# Patient Record
Sex: Female | Born: 1990
Health system: Southern US, Community
[De-identification: ages and names within clinical notes are randomized; demographics above are authoritative.]

---
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
## 2008-01-21 ENCOUNTER — Other Ambulatory Visit: Admission: RE | Admit: 2008-01-21 | Discharge: 2008-01-21 | Payer: Self-pay | Admitting: Family Medicine

## 2008-01-25 ENCOUNTER — Encounter: Admission: RE | Admit: 2008-01-25 | Discharge: 2008-01-25 | Payer: Self-pay | Admitting: Family Medicine

## 2009-03-31 ENCOUNTER — Other Ambulatory Visit: Admission: RE | Admit: 2009-03-31 | Discharge: 2009-03-31 | Payer: Self-pay | Admitting: Family Medicine

## 2010-09-10 ENCOUNTER — Other Ambulatory Visit: Payer: Self-pay | Admitting: Family Medicine

## 2010-09-10 ENCOUNTER — Other Ambulatory Visit (HOSPITAL_COMMUNITY)
Admission: RE | Admit: 2010-09-10 | Discharge: 2010-09-10 | Disposition: A | Discharge status: Home / Self Care | Payer: 59 | Source: Ambulatory Visit | Attending: Family Medicine | Admitting: Family Medicine

## 2010-09-10 DIAGNOSIS — Z113 Encounter for screening for infections with a predominantly sexual mode of transmission: Secondary | ICD-10-CM | POA: Insufficient documentation

## 2010-09-10 DIAGNOSIS — Z124 Encounter for screening for malignant neoplasm of cervix: Secondary | ICD-10-CM | POA: Insufficient documentation

## 2013-11-11 ENCOUNTER — Other Ambulatory Visit: Payer: Self-pay | Admitting: Family Medicine

## 2013-11-11 ENCOUNTER — Other Ambulatory Visit (HOSPITAL_COMMUNITY)
Admission: RE | Admit: 2013-11-11 | Discharge: 2013-11-11 | Disposition: A | Discharge status: Home / Self Care | Payer: 59 | Source: Ambulatory Visit | Attending: Family Medicine | Admitting: Family Medicine

## 2013-11-11 DIAGNOSIS — Z01419 Encounter for gynecological examination (general) (routine) without abnormal findings: Secondary | ICD-10-CM | POA: Insufficient documentation

## 2013-12-08 ENCOUNTER — Other Ambulatory Visit: Payer: Self-pay | Admitting: Family Medicine

## 2013-12-08 DIAGNOSIS — N911 Secondary amenorrhea: Secondary | ICD-10-CM

## 2013-12-08 DIAGNOSIS — N912 Amenorrhea, unspecified: Secondary | ICD-10-CM

## 2013-12-21 ENCOUNTER — Other Ambulatory Visit: Payer: 59

## 2014-01-18 ENCOUNTER — Other Ambulatory Visit: Payer: 59

## 2017-06-03 ENCOUNTER — Other Ambulatory Visit (HOSPITAL_COMMUNITY)
Admission: RE | Admit: 2017-06-03 | Discharge: 2017-06-03 | Disposition: A | Discharge status: Home / Self Care | Payer: BLUE CROSS/BLUE SHIELD | Source: Ambulatory Visit | Attending: Family Medicine | Admitting: Family Medicine

## 2017-06-03 ENCOUNTER — Other Ambulatory Visit: Payer: Self-pay | Admitting: Family Medicine

## 2017-06-03 DIAGNOSIS — Z124 Encounter for screening for malignant neoplasm of cervix: Secondary | ICD-10-CM | POA: Diagnosis present

## 2017-06-04 LAB — CYTOLOGY - PAP
Diagnosis: NEGATIVE
HPV (WINDOPATH): NOT DETECTED

## 2018-05-25 ENCOUNTER — Ambulatory Visit (INDEPENDENT_AMBULATORY_CARE_PROVIDER_SITE_OTHER): Payer: 59

## 2018-05-25 ENCOUNTER — Encounter (HOSPITAL_COMMUNITY): Payer: Self-pay | Admitting: Family Medicine

## 2018-05-25 ENCOUNTER — Ambulatory Visit (HOSPITAL_COMMUNITY)
Admission: EM | Admit: 2018-05-25 | Discharge: 2018-05-25 | Disposition: A | Discharge status: Home / Self Care | Payer: 59 | Attending: Family Medicine | Admitting: Family Medicine

## 2018-05-25 DIAGNOSIS — M79641 Pain in right hand: Secondary | ICD-10-CM

## 2018-05-25 DIAGNOSIS — S62524A Nondisplaced fracture of distal phalanx of right thumb, initial encounter for closed fracture: Secondary | ICD-10-CM

## 2018-05-25 MED ORDER — IBUPROFEN 800 MG PO TABS: 800.0000 mg | ORAL_TABLET | Freq: Three times a day (TID) | ORAL | 0 refills | Status: DC
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
## 2018-05-25 NOTE — Discharge Instructions (Addendum)
You have fracture your right thumb  We will place you in a splint and have you follow up with ortho Ibuprofen for pain and inflammation. Ice/elevate
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
## 2018-05-26 ENCOUNTER — Encounter (INDEPENDENT_AMBULATORY_CARE_PROVIDER_SITE_OTHER): Payer: Self-pay | Admitting: Family Medicine

## 2018-05-26 ENCOUNTER — Ambulatory Visit (INDEPENDENT_AMBULATORY_CARE_PROVIDER_SITE_OTHER): Payer: 59 | Admitting: Family Medicine

## 2018-05-26 DIAGNOSIS — S62524A Nondisplaced fracture of distal phalanx of right thumb, initial encounter for closed fracture: Secondary | ICD-10-CM

## 2018-05-26 MED ORDER — HYDROCODONE-ACETAMINOPHEN 5-325 MG PO TABS: 1.0000 | ORAL_TABLET | Freq: Four times a day (QID) | ORAL | 0 refills | Status: DC | PRN
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
## 2018-06-09 ENCOUNTER — Encounter (INDEPENDENT_AMBULATORY_CARE_PROVIDER_SITE_OTHER): Payer: Self-pay | Admitting: Family Medicine

## 2018-06-09 ENCOUNTER — Ambulatory Visit (INDEPENDENT_AMBULATORY_CARE_PROVIDER_SITE_OTHER): Payer: Self-pay

## 2018-06-09 ENCOUNTER — Ambulatory Visit (INDEPENDENT_AMBULATORY_CARE_PROVIDER_SITE_OTHER): Payer: 59 | Admitting: Family Medicine

## 2018-06-09 DIAGNOSIS — S62524D Nondisplaced fracture of distal phalanx of right thumb, subsequent encounter for fracture with routine healing: Secondary | ICD-10-CM | POA: Diagnosis not present

## 2018-06-09 NOTE — Progress Notes (Signed)
I saw and examined the patient with Dr. Jamse MeadBarron and agree with assessment and plan as outlined.  Fracture stable and with anatomic alignment.  Will start carefully working on ROM.  X-Ray in 2-3 weeks.
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
## 2018-06-10 ENCOUNTER — Ambulatory Visit (INDEPENDENT_AMBULATORY_CARE_PROVIDER_SITE_OTHER): Payer: 59 | Admitting: Family Medicine

## 2018-06-10 DIAGNOSIS — E118 Type 2 diabetes mellitus with unspecified complications: Secondary | ICD-10-CM | POA: Diagnosis not present

## 2018-06-10 DIAGNOSIS — Z13220 Encounter for screening for lipoid disorders: Secondary | ICD-10-CM | POA: Diagnosis not present

## 2018-06-10 DIAGNOSIS — Z0000 Encounter for general adult medical examination without abnormal findings: Secondary | ICD-10-CM | POA: Diagnosis not present

## 2018-06-15 ENCOUNTER — Encounter: Payer: Self-pay | Admitting: Registered"

## 2018-06-15 ENCOUNTER — Encounter: Payer: 59 | Attending: Family Medicine | Admitting: Registered"

## 2018-06-15 DIAGNOSIS — Z713 Dietary counseling and surveillance: Secondary | ICD-10-CM | POA: Diagnosis not present

## 2018-06-15 DIAGNOSIS — E119 Type 2 diabetes mellitus without complications: Secondary | ICD-10-CM | POA: Diagnosis not present

## 2018-06-15 NOTE — Progress Notes (Signed)
Diabetes Self-Management Education  Visit Type: First/Initial  Appt. Start Time: 0910 Appt. End Time: 1010  06/15/2018  Ms. Kelly Castro, identified by name and date of birth, is a 27 y.o. female with a diagnosis of Diabetes: Type 2.   ASSESSMENT Pt states she has had pre-diabetes for awhile. Pt states even though she was not diagnosed with T2DM until this month she has been taking 500 mg of Metformin x1 year. Pt states she remembers her father checking his blood sugar often managing his T2DM.  Pt states she has history of irregular periods and has had them regulated with birth control for awhile. Pt states PCOS has not been discussed or investigated. RD did not go into a lot of detail regarding PCOS, but believes Ovasitol may help her even if she does not have PCOS.  Pt states she knows breakfast is hard because she needs something quick. Pt states she will drink an occasional sweet tea, but mostly drinks non-sugar beverages.  Sleep: 4-5 hrs or 8 hrs. Pt states her doctor has told her it is due to anxiety and she takes a OTC stress formula which she thinks has melatonin which helps her sleep within 1/2 hr-45 min. Pt reports restless legs. Stress: 6/10 mostly due to work demands Energy: 4/10 - time of year and work. 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 Pt states she enjoys swimming and does more in the spring, in charge of aquatics classes. 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 Pt states she is working 2 jobs trying to save to buy a house.  Sample provided: Ovasitol Lot #: [id number] Exp: 7/21  Diabetes Self-Management Education - 06/15/18 0918      Visit Information   Visit Type  First/Initial      Initial Visit   Diabetes Type  Type 2    Are you currently following a meal plan?  No    Are you taking your medications as  prescribed?  Yes    Date Diagnosed  05/2018      Health Coping   How would you rate your overall health?  Excellent      Psychosocial Assessment   Patient Belief/Attitude about Diabetes  Motivated to manage diabetes    How often do you need to have someone help you when you read instructions, pamphlets, or other written materials from your doctor or pharmacy?  1 - Never    What is the last grade level you completed in school?  graduate school      Complications   Last HgB A1C per patient/outside source  6.7 %    How often do you check your blood sugar?  0 times/day (not testing)    Have you had a dilated eye exam in the past 12 months?  Yes    Have you had a dental exam in the past 12 months?  Yes    Are you checking your feet?  No      Dietary Intake   Breakfast  cereal OR bagel, cream chees or with egg & canadian bacon    Snack (morning)  none    Lunch  lean cuisine OR subway, chips, coke zero    Snack (afternoon)  none    Financial risk analyst (evening)  none  Beverage(s)  water, diet coke      Exercise   Exercise Type  Light (walking / raking leaves)    How many days per week to you exercise?  1    How many minutes per day do you exercise?  30    Total minutes per week of exercise  30      Patient Education   Previous Diabetes Education  No    Disease state   Definition of diabetes, type 1 and 2, and the diagnosis of diabetes    Nutrition management   Role of diet in the treatment of diabetes and the relationship between the three main macronutrients and blood glucose level;Food label reading, portion sizes and measuring food.    Physical activity and exercise   Role of exercise on diabetes management, blood pressure control and cardiac health.    Medications  Reviewed patients medication for diabetes, action, purpose, timing of dose and side effects.    Monitoring  Purpose and frequency of SMBG.    Psychosocial adjustment  Role of stress on  diabetes      Individualized Goals (developed by patient)   Nutrition  General guidelines for healthy choices and portions discussed    Physical Activity  Exercise 3-5 times per week    Medications  take my medication as prescribed      Outcomes   Expected Outcomes  Demonstrated interest in learning. Expect positive outcomes    Future DMSE  PRN    Program Status  Completed      Individualized Plan for Diabetes Self-Management Training:   Learning Objective:  Patient will have a greater understanding of diabetes self-management. Patient education plan is to attend individual and/or group sessions per assessed needs and concerns.   Patient Instructions  Consider getting more protein with breakfast Consider trying Subway salad Consider getting more regular exercise. Ultimate goal is 3-5 x per week at moderate intensity. You can try Calm magnesium supplement for restless legs, better sleep. Mindful Meditation free 8-week course online https://palousemindfulness.com/ You can try Ovasitol to help with blood sugar control  Expected Outcomes:  Demonstrated interest in learning. Expect positive outcomes  Education material provided: ADA Diabetes: Your Take Control Guide and A1C conversion sheet. What is Inositol?  If problems or questions, patient to contact team via:  Phone  Future DSME appointment: PRN

## 2018-06-15 NOTE — Patient Instructions (Addendum)
Consider getting more protein with breakfast Consider trying Subway salad Consider getting more regular exercise. Ultimate goal is 3-5 x per week at moderate intensity. You can try Calm magnesium supplement for restless legs, better sleep. Mindful Meditation free 8-week course online [email or web address]/ You can try Ovasitol to help with blood sugar control

## 2018-06-30 ENCOUNTER — Ambulatory Visit (INDEPENDENT_AMBULATORY_CARE_PROVIDER_SITE_OTHER): Payer: 59

## 2018-06-30 ENCOUNTER — Encounter (INDEPENDENT_AMBULATORY_CARE_PROVIDER_SITE_OTHER): Payer: Self-pay | Admitting: Family Medicine

## 2018-06-30 ENCOUNTER — Ambulatory Visit (INDEPENDENT_AMBULATORY_CARE_PROVIDER_SITE_OTHER): Payer: 59 | Admitting: Family Medicine

## 2018-06-30 DIAGNOSIS — S62524D Nondisplaced fracture of distal phalanx of right thumb, subsequent encounter for fracture with routine healing: Secondary | ICD-10-CM | POA: Diagnosis not present
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
## 2019-07-08 ENCOUNTER — Ambulatory Visit: Payer: BC Managed Care – PPO | Attending: Internal Medicine

## 2019-07-08 ENCOUNTER — Other Ambulatory Visit: Payer: Self-pay

## 2019-07-08 DIAGNOSIS — Z20822 Contact with and (suspected) exposure to covid-19: Secondary | ICD-10-CM

## 2019-07-10 LAB — NOVEL CORONAVIRUS, NAA: SARS-CoV-2, NAA: NOT DETECTED

## 2019-08-02 ENCOUNTER — Ambulatory Visit: Payer: BC Managed Care – PPO

## 2019-08-11 ENCOUNTER — Ambulatory Visit: Payer: BC Managed Care – PPO | Attending: Internal Medicine

## 2019-08-11 ENCOUNTER — Other Ambulatory Visit: Payer: Self-pay

## 2019-08-11 DIAGNOSIS — Z20822 Contact with and (suspected) exposure to covid-19: Secondary | ICD-10-CM

## 2019-08-12 LAB — NOVEL CORONAVIRUS, NAA: SARS-CoV-2, NAA: NOT DETECTED

## 2019-09-06 ENCOUNTER — Ambulatory Visit: Payer: BC Managed Care – PPO | Attending: Internal Medicine

## 2019-09-06 ENCOUNTER — Other Ambulatory Visit: Payer: BC Managed Care – PPO

## 2019-09-06 ENCOUNTER — Other Ambulatory Visit: Payer: Self-pay

## 2019-09-06 DIAGNOSIS — Z20822 Contact with and (suspected) exposure to covid-19: Secondary | ICD-10-CM

## 2019-09-07 LAB — NOVEL CORONAVIRUS, NAA: SARS-CoV-2, NAA: NOT DETECTED

## 2019-12-02 ENCOUNTER — Other Ambulatory Visit: Payer: BC Managed Care – PPO
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
